# Patient Record
Sex: Male | Born: 1995 | Race: White | Hispanic: No | Marital: Single | Smoking: Never smoker
Health system: Southern US, Community
[De-identification: ages and names within clinical notes are randomized; demographics above are authoritative.]

## PROBLEM LIST (undated history)

## (undated) DIAGNOSIS — G709 Myoneural disorder, unspecified: Secondary | ICD-10-CM

## (undated) DIAGNOSIS — F79 Unspecified intellectual disabilities: Secondary | ICD-10-CM

## (undated) DIAGNOSIS — M419 Scoliosis, unspecified: Secondary | ICD-10-CM

## (undated) DIAGNOSIS — G809 Cerebral palsy, unspecified: Secondary | ICD-10-CM

## (undated) DIAGNOSIS — Q860 Fetal alcohol syndrome (dysmorphic): Secondary | ICD-10-CM

## (undated) HISTORY — DX: Scoliosis, unspecified: M41.9

## (undated) HISTORY — PX: ORCHIOPEXY: SHX479

## (undated) HISTORY — DX: Fetal alcohol syndrome (dysmorphic): Q86.0

## (undated) HISTORY — DX: Cerebral palsy, unspecified: G80.9

## (undated) HISTORY — DX: Myoneural disorder, unspecified: G70.9

## (undated) HISTORY — DX: Unspecified intellectual disabilities: F79

---
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
## 2017-03-01 DIAGNOSIS — G809 Cerebral palsy, unspecified: Secondary | ICD-10-CM | POA: Insufficient documentation

## 2017-03-01 DIAGNOSIS — M419 Scoliosis, unspecified: Secondary | ICD-10-CM | POA: Insufficient documentation

## 2019-06-22 ENCOUNTER — Ambulatory Visit: Payer: Medicare Other | Attending: Internal Medicine

## 2019-06-22 DIAGNOSIS — Z23 Encounter for immunization: Secondary | ICD-10-CM

## 2019-06-22 NOTE — Progress Notes (Signed)
   Covid-19 Vaccination Clinic  Name:  Brian Nichols    MRN: [id number] DOB: Jun 22, 1995  06/22/2019  [name] was observed post Covid-19 immunization for 15 minutes without incident. He was provided with Vaccine Information Sheet and instruction to access the V-Safe system.   [name] was instructed to call 911 with any severe reactions post vaccine: Marland Kitchen Difficulty breathing  . Swelling of face and throat  . A fast heartbeat  . A bad rash all over body  . Dizziness and weakness   Immunizations Administered    Name Date Dose VIS Date Route   Moderna COVID-19 Vaccine 06/22/2019 10:22 AM 0.5 mL 02/27/2019 Intramuscular   Manufacturer: Moderna   Lot: [id number]   NDC: 34742-595-63

## 2019-07-24 ENCOUNTER — Ambulatory Visit: Payer: Medicare Other | Attending: Internal Medicine

## 2019-07-24 DIAGNOSIS — Z23 Encounter for immunization: Secondary | ICD-10-CM

## 2019-07-24 NOTE — Progress Notes (Signed)
   Covid-19 Vaccination Clinic  Name:  Kaheem Halleck    MRN: [id number] DOB: 1995/10/30  07/24/2019  Mr. Neumeister was observed post Covid-19 immunization for 15 minutes without incident. He was provided with Vaccine Information Sheet and instruction to access the V-Safe system.   Mr. Kamara was instructed to call 911 with any severe reactions post vaccine: Marland Kitchen Difficulty breathing  . Swelling of face and throat  . A fast heartbeat  . A bad rash all over body  . Dizziness and weakness   Immunizations Administered    Name Date Dose VIS Date Route   Moderna COVID-19 Vaccine 07/24/2019 11:41 AM 0.5 mL 02/2019 Intramuscular   Manufacturer: Moderna   Lot: [id number]   NDC: 80998-338-25

## 2023-08-04 ENCOUNTER — Encounter: Payer: Self-pay | Admitting: Family Medicine

## 2023-08-04 ENCOUNTER — Telehealth: Payer: Self-pay | Admitting: Family Medicine

## 2023-08-04 ENCOUNTER — Ambulatory Visit (INDEPENDENT_AMBULATORY_CARE_PROVIDER_SITE_OTHER): Payer: Self-pay | Admitting: Family Medicine

## 2023-08-04 VITALS — BP 128/65 | HR 66 | Resp 16 | Ht 63.5 in | Wt 106.9 lb

## 2023-08-04 DIAGNOSIS — Z114 Encounter for screening for human immunodeficiency virus [HIV]: Secondary | ICD-10-CM

## 2023-08-04 DIAGNOSIS — L700 Acne vulgaris: Secondary | ICD-10-CM

## 2023-08-04 DIAGNOSIS — Z789 Other specified health status: Secondary | ICD-10-CM

## 2023-08-04 DIAGNOSIS — Z7689 Persons encountering health services in other specified circumstances: Secondary | ICD-10-CM

## 2023-08-04 DIAGNOSIS — Z23 Encounter for immunization: Secondary | ICD-10-CM | POA: Diagnosis not present

## 2023-08-04 DIAGNOSIS — Z1159 Encounter for screening for other viral diseases: Secondary | ICD-10-CM

## 2023-08-04 DIAGNOSIS — G804 Ataxic cerebral palsy: Secondary | ICD-10-CM | POA: Diagnosis not present

## 2023-08-04 DIAGNOSIS — R7989 Other specified abnormal findings of blood chemistry: Secondary | ICD-10-CM | POA: Diagnosis not present

## 2023-08-04 DIAGNOSIS — Z130 Encounter for screening for diseases of the blood and blood-forming organs and certain disorders involving the immune mechanism: Secondary | ICD-10-CM

## 2023-08-04 DIAGNOSIS — Z13220 Encounter for screening for lipoid disorders: Secondary | ICD-10-CM

## 2023-08-04 DIAGNOSIS — Z1321 Encounter for screening for nutritional disorder: Secondary | ICD-10-CM

## 2023-08-04 MED ORDER — ADAPALENE-BENZOYL PEROXIDE 0.1-2.5 % EX GEL
1.0000 | Freq: Every day | CUTANEOUS | 1 refills | Status: DC
Start: 2023-08-04 — End: 2023-09-22

## 2023-08-04 NOTE — Telephone Encounter (Signed)
 [facility] pharmacy is requesting refill  Adapalene-Benzoyl Peroxide (EPIDUO) 0.1-2.5 % gel   Please advise

## 2023-08-04 NOTE — Patient Instructions (Signed)
 Acne Plan  Products: Face Wash:  Use a gentle cleanser, such as Cetaphil (generic version of this is fine) Moisturizer:  Use an "oil-free" moisturizer with SPF Prescription Cream(s):  None in the morning and EpiDuo at bedtime  Morning: Wash face, then completely dry Apply Moisturizer to entire face  Bedtime: Wash face, then completely dry Apply EpiDuo, pea size amount that you massage into problem areas on the face.  Remember: Your acne will probably get worse before it gets better It takes at least 2 months for the medicines to start working Use oil free soaps and lotions; these can be over the counter or store-brand Don't use harsh scrubs or astringents, these can make skin irritation and acne worse Moisturize daily with oil free lotion because the acne medicines will dry your skin  Call your doctor if you have: Lots of skin dryness or redness that doesn't get better if you use a moisturizer or if you use the prescription cream or lotion every other day    Stop using the acne medicine immediately and see your doctor if you are or become pregnant or if you think you had an allergic reaction (itchy rash, difficulty breathing, nausea, vomiting) to your acne medication.

## 2023-08-04 NOTE — Progress Notes (Signed)
 New patient visit   Patient: Brian Nichols   DOB: Apr 22, 1995   27 y.o. Male  MRN: [id number] Visit Date: 08/04/2023  Today's healthcare provider: Carlean Charter, DO   Chief Complaint  Patient presents with   Establish Care    Patient here with legal guardian Lasandra Points. Patient with cerebral palsy.   Subjective    Brian Nichols is a 28 y.o. male who presents today as a new patient to establish care.  HPI HPI     Establish Care    Additional comments: Patient here with legal guardian Lasandra Points. Patient with cerebral palsy.      Last edited by Rosas, Joseline E, CMA on 08/04/2023  8:44 AM.      Brian Nichols is a 28 year old male who presents, accompanied by his guardian, with skin concerns and to establish care.  He is experiencing persistent skin issues primarily on his face. He has tried various treatments including face washes containing salicylic acid and makeup removers, but these have not been effective. He has also attempted using a more sensitive face wash without success.  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 There are no new concerns related to these conditions during this visit.  He has been residing in [place]  since 2005 and does not get outside often, which may impact his vitamin D levels. He has glasses but often chooses not to wear them.  He is not an explorative eater.   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"   Family Status  Relation Name Status   Mother  (Not Specified)   Father  (Not Specified)   Other  (Not Specified)  No partnership data on file   Family History  Problem  Relation Age of Onset   Alcohol abuse Mother    Depression Mother    Bipolar disorder Mother    Diabetes Father        Type 1   Lupus Other    Social History   Socioeconomic History   Marital status: Single    Spouse name: Not on file   Number of children: Not on file   Years of education: Not on file   Highest education level: Not on file  Occupational History   Not on file  Tobacco Use   Smoking status: Never   Smokeless tobacco: Never  Vaping Use   Vaping status: Never Used  Substance and Sexual Activity   Alcohol use: Never   Drug use: Never   Sexual activity: Not on file  Other Topics Concern   Not on file  Social History Narrative   Not on file   Social Drivers of Health   Financial Resource Strain: Not on file  Food Insecurity: Not on file  Transportation Needs: Not on file  Physical Activity: Not on file  Stress: Not on file  Social Connections: Not on file   No outpatient medications prior to visit.   No facility-administered medications prior to visit.   No Known Allergies  Immunization History  Administered Date(s) Administered   HPV Quadrivalent 12/12/2009, 12/19/2010,  02/10/2012   Hepatitis A, Ped/Adol-2 Dose 12/12/2009, 12/19/2010   Influenza, Seasonal, Injecte, Preservative Fre 02/21/2006, 02/08/2008   Influenza,inj,Quad PF,6+ Mos 05/16/2018   MMR 07/11/2003   Meningococcal B, OMV 05/16/2018, 08/04/2023   Meningococcal Conjugate 12/12/2009, 06/01/2016   Moderna Sars-Covid-2 Vaccination 06/22/2019, 07/24/2019   Novel Infuenza-h1n1-09 02/08/2008   Tdap 12/12/2009, 08/04/2023   Varicella 07/17/2005    Health Maintenance  Topic Date Due   Medicare Annual Wellness (AWV)  Never done   HIV Screening  Never done   Hepatitis C Screening  Never done   COVID-19 Vaccine (3 - 2024-25 season) 12/28/2023 (Originally 11/28/2022)   INFLUENZA VACCINE  10/28/2023   DTaP/Tdap/Td (3 - Td or Tdap) 08/03/2033   HPV VACCINES  Completed   Meningococcal B  Vaccine  Completed    Patient Care Team: Carlean Charter, DO as PCP - General (Family Medicine)  Review of Systems  Constitutional:  Negative for appetite change, chills, fatigue and fever.  HENT:  Negative for congestion, ear pain, hearing loss, nosebleeds and trouble swallowing.   Eyes:  Negative for pain and visual disturbance.  Respiratory:  Negative for cough, chest tightness and shortness of breath.   Cardiovascular:  Negative for chest pain and leg swelling.  Gastrointestinal:  Negative for abdominal pain, constipation, diarrhea, nausea and vomiting.  Endocrine: Negative for polyuria.  Genitourinary:  Negative for dysuria.  Musculoskeletal:  Negative for arthralgias, back pain, joint swelling, myalgias and neck stiffness.  Skin:  Negative for color change, rash and wound.  Neurological:  Negative for tremors, weakness and headaches.  Psychiatric/Behavioral:  Negative for behavioral problems, confusion, decreased concentration and sleep disturbance.   All other systems reviewed and are negative.       Objective    BP 128/65 (BP Location: Left Arm, Patient Position: Sitting, Cuff Size: Small)   Pulse 66   Resp 16   Ht 5' 3.5" (1.613 m)   Wt 106 lb 14.4 oz (48.5 kg)   SpO2 99%   BMI 18.64 kg/m     Physical Exam Vitals and nursing note reviewed.  Constitutional:      General: He is awake.     Appearance: Normal appearance.  HENT:     Head: Normocephalic and atraumatic.     Right Ear: Tympanic membrane, ear canal and external ear normal.     Left Ear: Tympanic membrane, ear canal and external ear normal.     Nose: Nose normal.     Mouth/Throat:     Mouth: Mucous membranes are moist.     Pharynx: Oropharynx is clear. No oropharyngeal exudate or posterior oropharyngeal erythema.  Eyes:     General: No scleral icterus.    Extraocular Movements: Extraocular movements intact.     Conjunctiva/sclera: Conjunctivae normal.     Pupils: Pupils are equal, round, and  reactive to light.  Neck:     Thyroid: No thyromegaly or thyroid tenderness.  Cardiovascular:     Rate and Rhythm: Normal rate and regular rhythm.     Pulses: Normal pulses.     Heart sounds: Normal heart sounds.  Pulmonary:     Effort: Pulmonary effort is normal. No tachypnea, bradypnea or respiratory distress.     Breath sounds: Normal breath sounds. No stridor. No wheezing, rhonchi or rales.  Abdominal:     General: Bowel sounds are normal. There is no distension.     Palpations: Abdomen is soft. There is no mass.     Tenderness: There is no abdominal tenderness.  There is no guarding.     Hernia: No hernia is present.  Musculoskeletal:     Cervical back: Normal range of motion and neck supple.     Thoracic back: Scoliosis present.     Right lower leg: No edema.     Left lower leg: No edema.  Lymphadenopathy:     Cervical: No cervical adenopathy.  Skin:    General: Skin is warm and dry.     Findings: Acne (densely through most of face, with some sparing of the forehead/temples) present.  Neurological:     Mental Status: He is alert and oriented to person, place, and time. Mental status is at baseline.  Psychiatric:        Mood and Affect: Mood normal.        Behavior: Behavior normal.     Depression Screen    08/04/2023    9:25 AM  PHQ 2/9 Scores  Exception Documentation Medical reason   No results found for any visits on 08/04/23.  Assessment & Plan     Establishing care with new doctor, encounter for  Ataxic cerebral palsy (HCC)  Acne vulgaris -     Adapalene-Benzoyl Peroxide; Apply 1 Application topically at bedtime.  Dispense: 45 g; Refill: 1  Hepatitis B vaccination status unknown -     Hepatitis B surface antibody,qualitative  Screening for endocrine, metabolic and immunity disorder -     Comprehensive metabolic panel with GFR -     Hemoglobin A1c -     TSH+T4F+T3Free  Abnormal thyroid blood test -     TSH+T4F+T3Free  Encounter for hepatitis C  screening test for low risk patient -     HCV Ab w Reflex to Quant PCR  Encounter for screening for HIV -     HIV Antibody (routine testing w rflx)  Screening for lipid disorders -     Lipid panel  Encounter for vitamin deficiency screening -     Vitamin B12 -     VITAMIN D 25 Hydroxy (Vit-D Deficiency, Fractures)  Need for Tdap vaccination -     Tdap vaccine greater than or equal to 7yo IM  Meningococcal group B vaccine administered -     Meningococcal B, OMV    Establishing care with a new doctor, encounter for Routine wellness visit to establish care. Discussed health screenings and vaccinations. Lab work covered under [employer]. - Perform physical exam. - Order comprehensive blood work: metabolic panel, A1c, cholesterol panel, thyroid level, HIV, hepatitis C, vitamin B12, vitamin D. - Schedule Medicare annual wellness screening.  Acne vulgaris Persistent acne unresponsive to OTC treatments with salicylic acid face wash and make-up astringent. Considering topical treatment with Epiduo. - Prescribe Epiduo gel, apply daily at bedtime. - Advise use of basic face wash, avoid drying agents like salicylic acid.  Apply moisturizer twice daily. - Instruct to contact if issues obtaining medication or if ineffective.   Return in about 1 month (around 09/04/2023) for mAWV.     I discussed the assessment and treatment plan with the patient  The patient was provided an opportunity to ask questions and all were answered. The patient agreed with the plan and demonstrated an understanding of the instructions.   The patient was advised to call back or seek an in-person evaluation if the symptoms worsen or if the condition fails to improve as anticipated.    Carlean Charter, DO  [facility] [phone] (phone) [phone] (fax)  [facility]

## 2023-08-05 LAB — LIPID PANEL
Chol/HDL Ratio: 2.4 ratio (ref 0.0–5.0)
Cholesterol, Total: 120 mg/dL (ref 100–199)
HDL: 50 mg/dL (ref >39)
LDL Chol Calc (NIH): 55 mg/dL (ref 0–99)
Triglycerides: 72 mg/dL (ref 0–149)
VLDL Cholesterol Cal: 15 mg/dL (ref 5–40)

## 2023-08-05 LAB — COMPREHENSIVE METABOLIC PANEL WITH GFR
ALT: 45 IU/L — ABNORMAL HIGH (ref 0–44)
AST: 36 IU/L (ref 0–40)
Albumin: 4.7 g/dL (ref 4.3–5.2)
Alkaline Phosphatase: 134 IU/L — ABNORMAL HIGH (ref 44–121)
BUN/Creatinine Ratio: 19 (ref 9–20)
BUN: 19 mg/dL (ref 6–20)
Bilirubin Total: 1.1 mg/dL (ref 0.0–1.2)
CO2: 24 mmol/L (ref 20–29)
Calcium: 10.5 mg/dL — ABNORMAL HIGH (ref 8.7–10.2)
Chloride: 101 mmol/L (ref 96–106)
Creatinine, Ser: 1 mg/dL (ref 0.76–1.27)
Globulin, Total: 3.4 g/dL (ref 1.5–4.5)
Glucose: 70 mg/dL (ref 70–99)
Potassium: 4.7 mmol/L (ref 3.5–5.2)
Sodium: 141 mmol/L (ref 134–144)
Total Protein: 8.1 g/dL (ref 6.0–8.5)
eGFR: 107 mL/min/{1.73_m2} (ref >59)

## 2023-08-05 LAB — TSH+T4F+T3FREE
Free T4: 1.21 ng/dL (ref 0.82–1.77)
T3, Free: 3.7 pg/mL (ref 2.0–4.4)
TSH: 1.84 u[IU]/mL (ref 0.450–4.500)

## 2023-08-05 LAB — VITAMIN D 25 HYDROXY (VIT D DEFICIENCY, FRACTURES): Vit D, 25-Hydroxy: 14.5 ng/mL — ABNORMAL LOW (ref 30.0–100.0)

## 2023-08-05 LAB — HEMOGLOBIN A1C
Est. average glucose Bld gHb Est-mCnc: 105 mg/dL
Hgb A1c MFr Bld: 5.3 % (ref 4.8–5.6)

## 2023-08-05 LAB — HIV ANTIBODY (ROUTINE TESTING W REFLEX): HIV Screen 4th Generation wRfx: NONREACTIVE

## 2023-08-05 LAB — HCV AB W REFLEX TO QUANT PCR: HCV Ab: NONREACTIVE

## 2023-08-05 LAB — VITAMIN B12: Vitamin B-12: 728 pg/mL (ref 232–1245)

## 2023-08-05 LAB — HEPATITIS B SURFACE ANTIBODY,QUALITATIVE: Hep B Surface Ab, Qual: REACTIVE

## 2023-08-05 LAB — HCV INTERPRETATION

## 2023-08-15 ENCOUNTER — Ambulatory Visit: Payer: Self-pay | Admitting: Family Medicine

## 2023-08-15 DIAGNOSIS — E559 Vitamin D deficiency, unspecified: Secondary | ICD-10-CM

## 2023-08-15 MED ORDER — VITAMIN D (ERGOCALCIFEROL) 1.25 MG (50000 UNIT) PO CAPS: 50000.0000 [IU] | ORAL_CAPSULE | ORAL | 1 refills | Status: AC

## 2023-08-15 NOTE — Progress Notes (Signed)
 Please advise the patient's legal guardian, Brian Nichols, that his blood work is overall fairly normal, except his vitamin D  is very low and his calcium is mildly elevated.  The calcium is likely a result of of the low vitamin D .  I went ahead and sent high-dose supplementation to his pharmacy and we can recheck the level in 6 months. - Also, his hepatitis B was reactive, indicating he has likely been vaccinated against hepatitis B and he is is currently immune.

## 2023-09-12 ENCOUNTER — Ambulatory Visit: Admitting: Family Medicine

## 2023-09-21 ENCOUNTER — Other Ambulatory Visit (HOSPITAL_COMMUNITY): Payer: Self-pay

## 2023-09-22 ENCOUNTER — Other Ambulatory Visit: Payer: Self-pay | Admitting: Family Medicine

## 2023-09-22 ENCOUNTER — Other Ambulatory Visit (HOSPITAL_COMMUNITY): Payer: Self-pay

## 2023-09-22 DIAGNOSIS — L700 Acne vulgaris: Secondary | ICD-10-CM

## 2023-09-22 MED ORDER — CLINDAMYCIN PHOSPHATE 1 % EX SWAB: 1.0000 | Freq: Two times a day (BID) | CUTANEOUS | 2 refills | Status: AC
